# Patient Record
Sex: Male | Born: 1970 | Race: White | Hispanic: No | Marital: Married | Smoking: Current every day smoker
Health system: Southern US, Community
[De-identification: ages and names within clinical notes are randomized; demographics above are authoritative.]

---

## 2018-08-24 DIAGNOSIS — S20459A Superficial foreign body of unspecified back wall of thorax, initial encounter: Secondary | ICD-10-CM | POA: Diagnosis not present

## 2018-08-24 DIAGNOSIS — W57XXXA Bitten or stung by nonvenomous insect and other nonvenomous arthropods, initial encounter: Secondary | ICD-10-CM | POA: Diagnosis not present

## 2018-08-24 DIAGNOSIS — K529 Noninfective gastroenteritis and colitis, unspecified: Secondary | ICD-10-CM | POA: Diagnosis not present

## 2018-08-26 ENCOUNTER — Other Ambulatory Visit: Payer: Self-pay

## 2018-08-26 ENCOUNTER — Emergency Department (HOSPITAL_COMMUNITY)
Admission: EM | Admit: 2018-08-26 | Discharge: 2018-08-26 | Disposition: A | Discharge status: Home / Self Care | Payer: BLUE CROSS/BLUE SHIELD | Attending: Emergency Medicine | Admitting: Emergency Medicine

## 2018-08-26 ENCOUNTER — Encounter (HOSPITAL_COMMUNITY): Payer: Self-pay | Admitting: *Deleted

## 2018-08-26 ENCOUNTER — Emergency Department (HOSPITAL_COMMUNITY): Payer: BLUE CROSS/BLUE SHIELD

## 2018-08-26 DIAGNOSIS — R531 Weakness: Secondary | ICD-10-CM | POA: Insufficient documentation

## 2018-08-26 DIAGNOSIS — W57XXXA Bitten or stung by nonvenomous insect and other nonvenomous arthropods, initial encounter: Secondary | ICD-10-CM | POA: Diagnosis not present

## 2018-08-26 DIAGNOSIS — M6281 Muscle weakness (generalized): Secondary | ICD-10-CM | POA: Diagnosis not present

## 2018-08-26 DIAGNOSIS — L988 Other specified disorders of the skin and subcutaneous tissue: Secondary | ICD-10-CM | POA: Insufficient documentation

## 2018-08-26 DIAGNOSIS — Z79899 Other long term (current) drug therapy: Secondary | ICD-10-CM | POA: Insufficient documentation

## 2018-08-26 DIAGNOSIS — F17210 Nicotine dependence, cigarettes, uncomplicated: Secondary | ICD-10-CM | POA: Diagnosis not present

## 2018-08-26 DIAGNOSIS — S30860A Insect bite (nonvenomous) of lower back and pelvis, initial encounter: Secondary | ICD-10-CM | POA: Diagnosis not present

## 2018-08-26 LAB — COMPREHENSIVE METABOLIC PANEL
ALT: 12 U/L (ref 0–44)
AST: 18 U/L (ref 15–41)
Albumin: 3.8 g/dL (ref 3.5–5.0)
Alkaline Phosphatase: 54 U/L (ref 38–126)
Anion gap: 7 (ref 5–15)
BUN: 11 mg/dL (ref 6–20)
CO2: 27 mmol/L (ref 22–32)
Calcium: 8.9 mg/dL (ref 8.9–10.3)
Chloride: 104 mmol/L (ref 98–111)
Creatinine, Ser: 0.81 mg/dL (ref 0.61–1.24)
GFR calc Af Amer: >60 mL/min (ref >60)
GFR calc non Af Amer: >60 mL/min (ref >60)
Glucose, Bld: 93 mg/dL (ref 70–99)
Potassium: 4.1 mmol/L (ref 3.5–5.1)
Sodium: 138 mmol/L (ref 135–145)
Total Bilirubin: 0.4 mg/dL (ref 0.3–1.2)
Total Protein: 6.5 g/dL (ref 6.5–8.1)

## 2018-08-26 LAB — CBC WITH DIFFERENTIAL/PLATELET
Abs Immature Granulocytes: 0.03 10*3/uL (ref 0.00–0.07)
Basophils Absolute: 0 10*3/uL (ref 0.0–0.1)
Basophils Relative: 1 %
Eosinophils Absolute: 0.4 10*3/uL (ref 0.0–0.5)
Eosinophils Relative: 8 %
HCT: 44.4 % (ref 39.0–52.0)
Hemoglobin: 14.2 g/dL (ref 13.0–17.0)
Immature Granulocytes: 1 %
Lymphocytes Relative: 33 %
Lymphs Abs: 1.7 10*3/uL (ref 0.7–4.0)
MCH: 31.4 pg (ref 26.0–34.0)
MCHC: 32 g/dL (ref 30.0–36.0)
MCV: 98.2 fL (ref 80.0–100.0)
Monocytes Absolute: 0.6 10*3/uL (ref 0.1–1.0)
Monocytes Relative: 11 %
Neutro Abs: 2.4 10*3/uL (ref 1.7–7.7)
Neutrophils Relative %: 46 %
Platelets: 229 10*3/uL (ref 150–400)
RBC: 4.52 MIL/uL (ref 4.22–5.81)
RDW: 14 % (ref 11.5–15.5)
WBC: 5.2 10*3/uL (ref 4.0–10.5)
nRBC: 0 % (ref 0.0–0.2)

## 2018-08-26 NOTE — ED Provider Notes (Signed)
[facility] EMERGENCY DEPARTMENT Provider Note   CSN: [id number] Arrival date & time: 08/26/18  1338    History   Chief Complaint Chief Complaint  Patient presents with  . Weakness    HPI Mark Cobb is a 48 y.o. male.     HPI Presents with weakness myalgias and diarrhea.  Today is Thursday and this weekend he was having some diarrhea.  On Monday or Tuesday he found a tick on his back.  Thinks it likely got on there over the weekend.  States he had to go to an urgent care to get the tick taken off since some of it was stuck.  States he continues to feel weakness.  States the diarrhea slowed up with medicines has been taking.  States he just feels bad.  Has had some chills.  No cough.  Does smoke.  Urgent care started doxycycline 2 days ago.  States he is not gotten better and may even be feeling worse. History reviewed. No pertinent past medical history.  There are no active problems to display for this patient.   History reviewed. No pertinent surgical history.      Home Medications    Prior to Admission medications   Medication Sig Start Date End Date Taking? Authorizing Provider  doxycycline (VIBRAMYCIN) 100 MG capsule Take 100 mg by mouth 2 (two) times a day. 08/24/18  Yes [provider]    Family History No family history on file.  Social History Social History   Tobacco Use  . Smoking status: Current Every Day Smoker    Packs/day: 1.50    Types: Cigarettes  . Smokeless tobacco: Never Used  Substance Use Topics  . Alcohol use: Yes    Comment: occasionally  . Drug use: Never     Allergies   Penicillins   Review of Systems Review of Systems  Constitutional: Positive for appetite change, chills and fatigue.  HENT: Negative for congestion.   Respiratory: Negative for shortness of breath.   Gastrointestinal: Positive for diarrhea. Negative for abdominal pain.  Genitourinary: Negative for flank pain.  Musculoskeletal: Negative for back  pain.  Skin: Positive for rash.  Neurological: Negative for weakness.  Psychiatric/Behavioral: Negative for confusion.     Physical Exam Updated Vital Signs BP 134/78 Comment: Simultaneous filing. User may not have seen previous data.  Pulse 88 Comment: Simultaneous filing. User may not have seen previous data.  Temp 98.1 F (36.7 C) (Oral)   Resp 16   Ht 5\' 8"  (1.727 m)   Wt 81.6 kg   SpO2 99% Comment: Simultaneous filing. User may not have seen previous data.  BMI 27.37 kg/m   Physical Exam Vitals signs and nursing note reviewed.  Constitutional:      Appearance: Normal appearance.  HENT:     Head: Atraumatic.     Mouth/Throat:     Mouth: Mucous membranes are moist.  Eyes:     Extraocular Movements: Extraocular movements intact.  Neck:     Musculoskeletal: Neck supple.  Pulmonary:     Breath sounds: Normal breath sounds.  Abdominal:     Tenderness: There is no abdominal tenderness.  Musculoskeletal:     Right lower leg: No edema.     Left lower leg: No edema.  Skin:    General: Skin is warm.     Comments: Right posterior shoulder area has approximately 1 cm ulcers area.  There is some erythema.  Somewhat around this there is erythematous area with small pustules that  appear to be where some tape had been over the area.  No fluctuance.  No rash over the palms.  Neurological:     General: No focal deficit present.      ED Treatments / Results  Labs (all labs ordered are listed, but only abnormal results are displayed) Labs Reviewed  COMPREHENSIVE METABOLIC PANEL  CBC WITH DIFFERENTIAL/PLATELET    EKG EKG Interpretation  Date/Time:  Thursday August 26 2018 14:07:25 EDT Ventricular Rate:  83 PR Interval:    QRS Duration: 99 QT Interval:  351 QTC Calculation: 413 R Axis:   79 Text Interpretation:  Sinus rhythm Baseline wander in lead(s) V3 Confirmed by Benjiman CorePickering, Bishoy Cupp [phone]([id number]) on 08/26/2018 3:32:45 PM   Radiology Dg Chest 2 View  Result Date:  08/26/2018 CLINICAL DATA:  Generalized weakness and body aches. EXAM: CHEST - 2 VIEW COMPARISON:  None. FINDINGS: The heart size and mediastinal contours are within normal limits. Both lungs are clear. The visualized skeletal structures are unremarkable. IMPRESSION: No active cardiopulmonary disease. Electronically Signed   By: Gerome Samavid  Williams III M.D   On: 08/26/2018 14:35    Procedures Procedures (including critical care time)  Medications Ordered in ED Medications - No data to display   Initial Impression / Assessment and Plan / ED Course  I have reviewed the triage vital signs and the nursing notes.  Pertinent labs & imaging results that were available during my care of the patient were reviewed by me and considered in my medical decision making (see chart for details).        Patient presents with generalized weakness.  Recent tick bite.  On the back has erythema and looks as if he has some reaction to the tape also.  Already on doxycycline but is only had a couple days of it.  I think lab work is reassuring.  Discharge home with continued antibiotics.  If this is a tickborne illness would not expect significant improvement at this time.  Discharge home to follow-up with PCP as needed  Final Clinical Impressions(s) / ED Diagnoses   Final diagnoses:  Generalized weakness  Tick bite, initial encounter    ED Discharge Orders    None       Benjiman CorePickering, Allayna Erlich, MD 08/26/18 1601

## 2018-08-26 NOTE — ED Triage Notes (Signed)
Pt c/o generalized weakness and body aches over the past couple of days. Pt reports he was bitten by a tick over the weekend on his back and was able to remove half of the tick. He was unable to remove all of the tick so he went to Urgent Care on Tuesday and had them remove the remainder. Pt was given Doxycycline then and told to be seen if he continued to feel weak. Pt reports his weakness and body aches are about the same or slightly worse than when it started. Pt also reports having chills.

## 2018-08-26 NOTE — Discharge Instructions (Addendum)
Continue to take the doxycycline.  Watch for worsening of the potential infection on her back.

## 2018-08-31 DIAGNOSIS — W57XXXS Bitten or stung by nonvenomous insect and other nonvenomous arthropods, sequela: Secondary | ICD-10-CM | POA: Diagnosis not present

## 2018-08-31 DIAGNOSIS — R197 Diarrhea, unspecified: Secondary | ICD-10-CM | POA: Diagnosis not present

## 2018-08-31 DIAGNOSIS — E860 Dehydration: Secondary | ICD-10-CM | POA: Diagnosis not present

## 2018-09-07 ENCOUNTER — Encounter (HOSPITAL_COMMUNITY): Payer: Self-pay | Admitting: Emergency Medicine

## 2018-09-07 ENCOUNTER — Emergency Department (HOSPITAL_COMMUNITY)
Admission: EM | Admit: 2018-09-07 | Discharge: 2018-09-07 | Disposition: A | Discharge status: Home / Self Care | Payer: BLUE CROSS/BLUE SHIELD | Attending: Emergency Medicine | Admitting: Emergency Medicine

## 2018-09-07 ENCOUNTER — Other Ambulatory Visit: Payer: Self-pay

## 2018-09-07 ENCOUNTER — Emergency Department (HOSPITAL_COMMUNITY): Payer: BLUE CROSS/BLUE SHIELD

## 2018-09-07 DIAGNOSIS — W57XXXA Bitten or stung by nonvenomous insect and other nonvenomous arthropods, initial encounter: Secondary | ICD-10-CM | POA: Diagnosis not present

## 2018-09-07 DIAGNOSIS — F17210 Nicotine dependence, cigarettes, uncomplicated: Secondary | ICD-10-CM | POA: Insufficient documentation

## 2018-09-07 DIAGNOSIS — R531 Weakness: Secondary | ICD-10-CM | POA: Diagnosis not present

## 2018-09-07 LAB — URINALYSIS, ROUTINE W REFLEX MICROSCOPIC
Bilirubin Urine: NEGATIVE
Glucose, UA: NEGATIVE mg/dL
Hgb urine dipstick: NEGATIVE
Ketones, ur: NEGATIVE mg/dL
Leukocytes,Ua: NEGATIVE
Nitrite: NEGATIVE
Protein, ur: NEGATIVE mg/dL
Specific Gravity, Urine: 1.006 (ref 1.005–1.030)
pH: 6 (ref 5.0–8.0)

## 2018-09-07 LAB — COMPREHENSIVE METABOLIC PANEL
ALT: 10 U/L (ref 0–44)
AST: 18 U/L (ref 15–41)
Albumin: 3.9 g/dL (ref 3.5–5.0)
Alkaline Phosphatase: 60 U/L (ref 38–126)
Anion gap: 9 (ref 5–15)
BUN: 14 mg/dL (ref 6–20)
CO2: 26 mmol/L (ref 22–32)
Calcium: 9.1 mg/dL (ref 8.9–10.3)
Chloride: 102 mmol/L (ref 98–111)
Creatinine, Ser: 0.89 mg/dL (ref 0.61–1.24)
GFR calc Af Amer: >60 mL/min (ref >60)
GFR calc non Af Amer: >60 mL/min (ref >60)
Glucose, Bld: 101 mg/dL — ABNORMAL HIGH (ref 70–99)
Potassium: 4.2 mmol/L (ref 3.5–5.1)
Sodium: 137 mmol/L (ref 135–145)
Total Bilirubin: 0.2 mg/dL — ABNORMAL LOW (ref 0.3–1.2)
Total Protein: 7 g/dL (ref 6.5–8.1)

## 2018-09-07 LAB — CBC
HCT: 44.6 % (ref 39.0–52.0)
Hemoglobin: 14.8 g/dL (ref 13.0–17.0)
MCH: 32.1 pg (ref 26.0–34.0)
MCHC: 33.2 g/dL (ref 30.0–36.0)
MCV: 96.7 fL (ref 80.0–100.0)
Platelets: 257 K/uL (ref 150–400)
RBC: 4.61 MIL/uL (ref 4.22–5.81)
RDW: 13.2 % (ref 11.5–15.5)
WBC: 7 K/uL (ref 4.0–10.5)
nRBC: 0 % (ref 0.0–0.2)

## 2018-09-07 LAB — TROPONIN I: Troponin I: <0.03 ng/mL (ref <0.03)

## 2018-09-07 LAB — CBG MONITORING, ED: Glucose-Capillary: 98 mg/dL (ref 70–99)

## 2018-09-07 LAB — TSH: TSH: 1.863 u[IU]/mL (ref 0.350–4.500)

## 2018-09-07 NOTE — Discharge Instructions (Addendum)
Test showed no life-threatening condition.  You need a primary care doctor.

## 2018-09-07 NOTE — ED Triage Notes (Signed)
Pt c/o generalized weakness x 1 week. Pt reports he was told to come to ED by urgent care for abnormal lab work?

## 2018-09-07 NOTE — ED Provider Notes (Signed)
[facility] EMERGENCY DEPARTMENT Provider Note   CSN: [id number] Arrival date & time: 09/07/18  1401    History   Chief Complaint Chief Complaint  Patient presents with  . Weakness    HPI Mark Cobb is a 48 y.o. male.     Generalized weakness for 2 weeks.  Symptoms first started after tick bite.  He was placed on doxycycline for 10 days for same.  Patient evaluated on 08/26/2018 for similar complaints.  CBC and chemistry panel normal at that time.  No chest pain, dyspnea, fever, sweats, chills, weight loss, neuro deficits, headache, new rash.  He states he is normally healthy.  Severity is moderate.  Nothing makes symptoms better or worse.     History reviewed. No pertinent past medical history.  There are no active problems to display for this patient.   History reviewed. No pertinent surgical history.      Home Medications    Prior to Admission medications   Medication Sig Start Date End Date Taking? Authorizing Provider  acetaminophen (TYLENOL) 500 MG tablet Take 500 mg by mouth every 6 (six) hours as needed for mild pain or moderate pain.   Yes [provider]  ibuprofen (ADVIL) 200 MG tablet Take 200 mg by mouth every 6 (six) hours as needed for mild pain or moderate pain.   Yes [provider]    Family History History reviewed. No pertinent family history.  Social History Social History   Tobacco Use  . Smoking status: Current Every Day Smoker    Packs/day: 1.50    Types: Cigarettes  . Smokeless tobacco: Never Used  Substance Use Topics  . Alcohol use: Yes    Comment: occasionally  . Drug use: Never     Allergies   Penicillins   Review of Systems Review of Systems  All other systems reviewed and are negative.    Physical Exam Updated Vital Signs BP 123/73   Pulse 78   Temp 98.3 F (36.8 C)   Resp 16   Ht 5\' 8"  (1.727 m)   Wt 81 kg   SpO2 97%   BMI 27.15 kg/m   Physical Exam Vitals signs and nursing note  reviewed.  Constitutional:      Appearance: He is well-developed.  HENT:     Head: Normocephalic and atraumatic.  Eyes:     Conjunctiva/sclera: Conjunctivae normal.  Neck:     Musculoskeletal: Neck supple.  Cardiovascular:     Rate and Rhythm: Normal rate and regular rhythm.  Pulmonary:     Effort: Pulmonary effort is normal.     Breath sounds: Normal breath sounds.  Abdominal:     General: Bowel sounds are normal.     Palpations: Abdomen is soft.  Musculoskeletal: Normal range of motion.  Skin:    General: Skin is warm and dry.  Neurological:     Mental Status: He is alert and oriented to person, place, and time.  Psychiatric:        Behavior: Behavior normal.      ED Treatments / Results  Labs (all labs ordered are listed, but only abnormal results are displayed) Labs Reviewed  URINALYSIS, ROUTINE W REFLEX MICROSCOPIC - Abnormal; Notable for the following components:      Result Value   Color, Urine STRAW (*)    All other components within normal limits  COMPREHENSIVE METABOLIC PANEL - Abnormal; Notable for the following components:   Glucose, Bld 101 (*)    Total Bilirubin 0.2 (*)  All other components within normal limits  CBC  TSH  TROPONIN I  ROCKY MTN SPOTTED FVR ABS PNL(IGG+IGM)  LYME DISEASE DNA BY PCR(BORRELIA BURG)  CBG MONITORING, ED    EKG EKG Interpretation  Date/Time:  Tuesday Sep 07 2018 16:36:08 EDT Ventricular Rate:  87 PR Interval:    QRS Duration: 86 QT Interval:  350 QTC Calculation: 421 R Axis:   71 Text Interpretation:  Sinus rhythm Confirmed by Donnetta Hutching ([id number]) on 09/07/2018 5:55:33 PM   Radiology Dg Chest 2 View  Result Date: 09/07/2018 CLINICAL DATA:  Generalized weakness for approximately 2 weeks. EXAM: CHEST - 2 VIEW COMPARISON:  PA and lateral chest 08/26/2018. FINDINGS: The lungs are clear. Heart size is normal. No pneumothorax or pleural effusion. No bony abnormality. IMPRESSION: Negative chest. Electronically Signed    By: Drusilla Kanner M.D.   On: 09/07/2018 18:32    Procedures Procedures (including critical care time)  Medications Ordered in ED Medications - No data to display   Initial Impression / Assessment and Plan / ED Course  I have reviewed the triage vital signs and the nursing notes.  Pertinent labs & imaging results that were available during my care of the patient were reviewed by me and considered in my medical decision making (see chart for details).        Patient presents with weakness and general malaise.  He coordinates his symptoms with a tick bite couple weeks ago.  Labs show no life-threatening condition.  RMSF and Lyme titer are pending.  Will discharge for primary care follow-up.  Final Clinical Impressions(s) / ED Diagnoses   Final diagnoses:  Weakness    ED Discharge Orders    None       Donnetta Hutching, MD 09/07/18 2041

## 2018-09-09 DIAGNOSIS — R5383 Other fatigue: Secondary | ICD-10-CM | POA: Diagnosis not present

## 2018-09-09 DIAGNOSIS — M791 Myalgia, unspecified site: Secondary | ICD-10-CM | POA: Diagnosis not present

## 2018-09-09 DIAGNOSIS — M2550 Pain in unspecified joint: Secondary | ICD-10-CM | POA: Diagnosis not present

## 2018-09-09 DIAGNOSIS — H6692 Otitis media, unspecified, left ear: Secondary | ICD-10-CM | POA: Diagnosis not present

## 2018-09-09 LAB — ROCKY MTN SPOTTED FVR ABS PNL(IGG+IGM)
RMSF IgG: POSITIVE — AB
RMSF IgM: 0.38 index (ref 0.00–0.89)

## 2018-09-09 LAB — RMSF, IGG, IFA: RMSF, IGG, IFA: <1:64 {titer}

## 2018-09-14 LAB — LYME DISEASE DNA BY PCR(BORRELIA BURG): Lyme Disease(B.burgdorferi)PCR: NEGATIVE

## 2018-09-21 DIAGNOSIS — A770 Spotted fever due to Rickettsia rickettsii: Secondary | ICD-10-CM | POA: Diagnosis not present

## 2018-09-21 DIAGNOSIS — R5383 Other fatigue: Secondary | ICD-10-CM | POA: Diagnosis not present

## 2018-09-21 DIAGNOSIS — M2550 Pain in unspecified joint: Secondary | ICD-10-CM | POA: Diagnosis not present

## 2018-09-21 DIAGNOSIS — M791 Myalgia, unspecified site: Secondary | ICD-10-CM | POA: Diagnosis not present

## 2018-09-29 DIAGNOSIS — M2550 Pain in unspecified joint: Secondary | ICD-10-CM | POA: Diagnosis not present

## 2018-09-29 DIAGNOSIS — Z0189 Encounter for other specified special examinations: Secondary | ICD-10-CM | POA: Diagnosis not present

## 2018-09-29 DIAGNOSIS — A770 Spotted fever due to Rickettsia rickettsii: Secondary | ICD-10-CM | POA: Diagnosis not present

## 2018-09-29 DIAGNOSIS — M791 Myalgia, unspecified site: Secondary | ICD-10-CM | POA: Diagnosis not present

## 2018-09-29 DIAGNOSIS — R5383 Other fatigue: Secondary | ICD-10-CM | POA: Diagnosis not present

## 2018-09-29 DIAGNOSIS — H6692 Otitis media, unspecified, left ear: Secondary | ICD-10-CM | POA: Diagnosis not present

## 2019-12-17 IMAGING — DX CHEST - 2 VIEW
2 series · 2 of 2 positions shown · non-contrast
Comparison: PA and lateral chest 08/26/2018.

CLINICAL DATA: Generalized weakness for approximately 2 weeks.

EXAM:
CHEST - 2 VIEW

[chest lat]
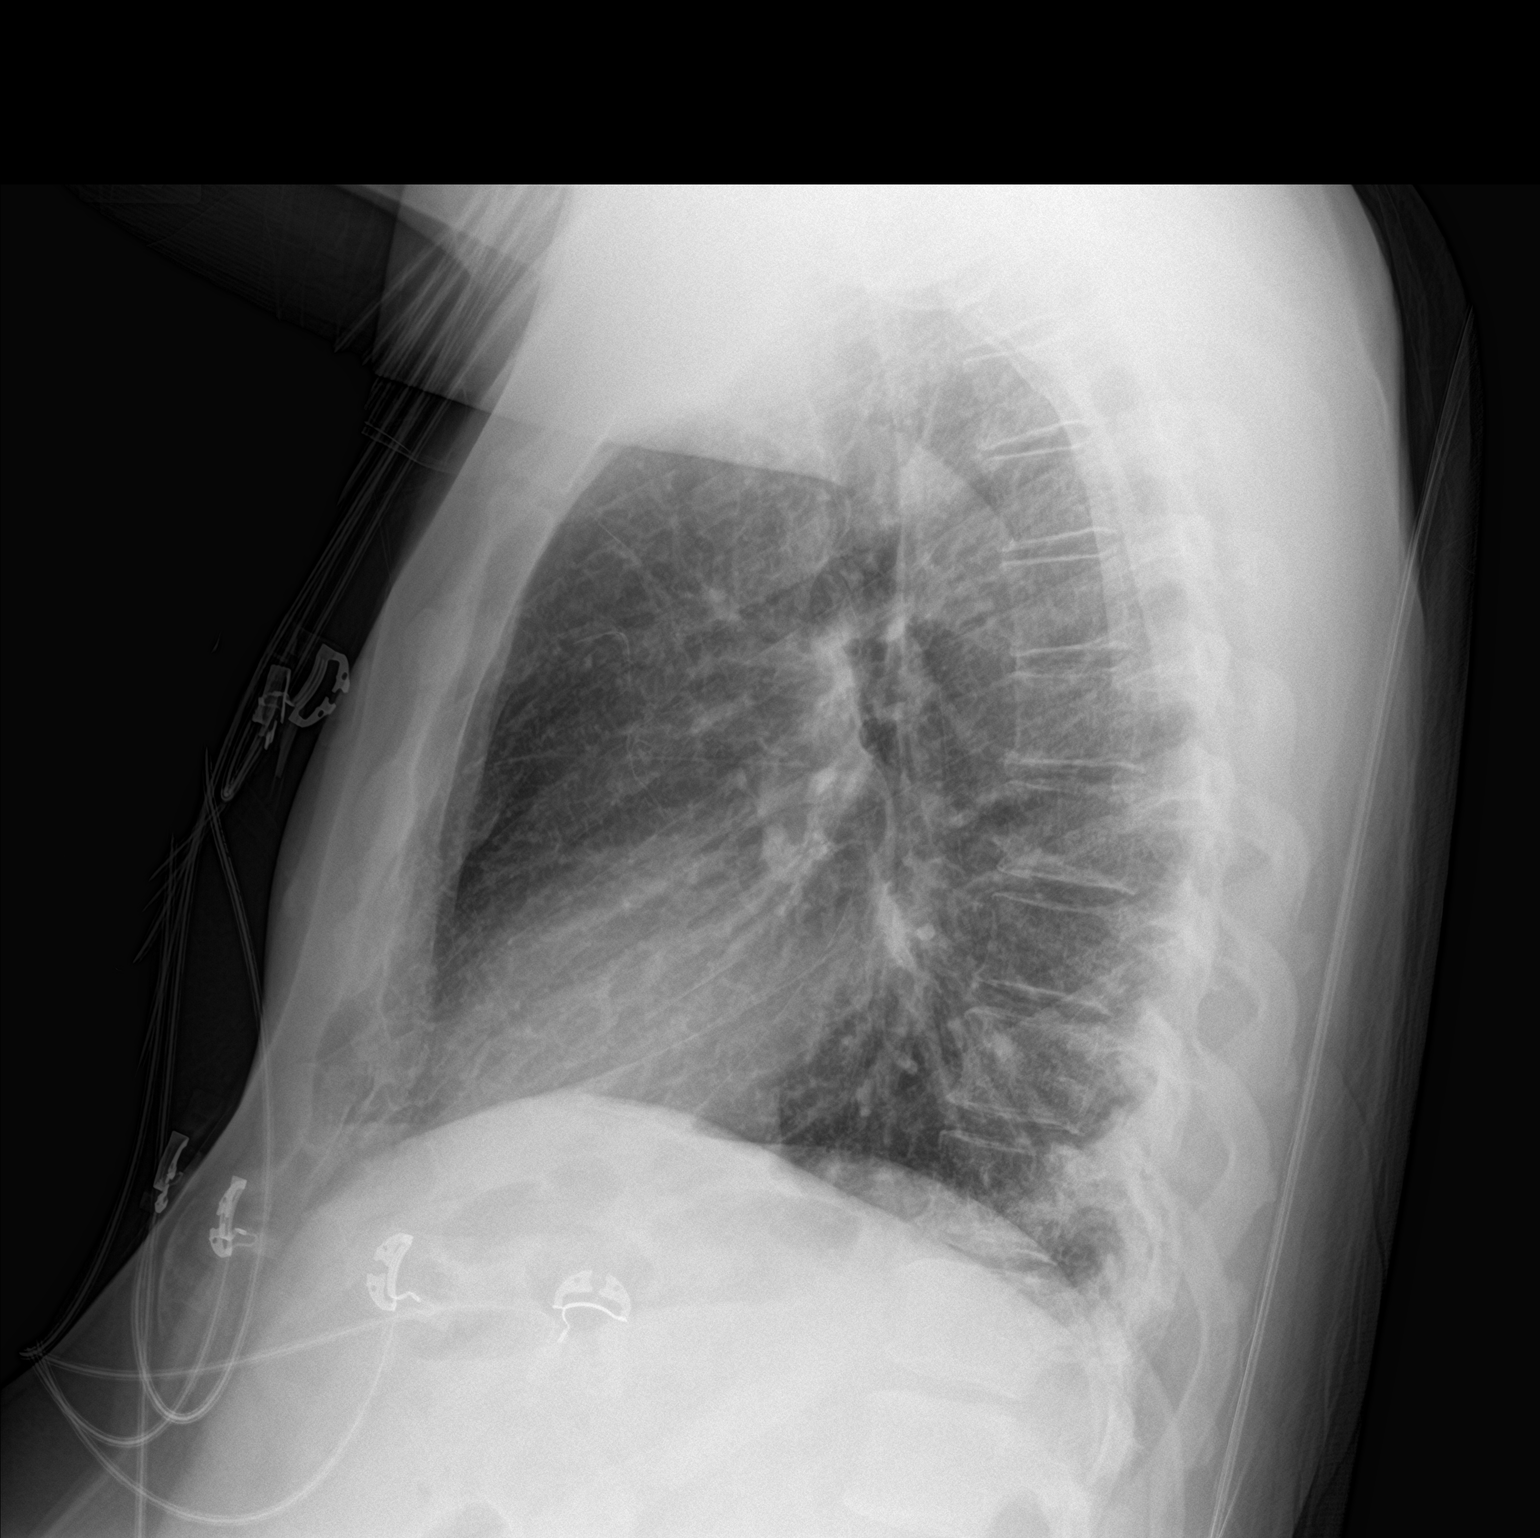

[chest ap]
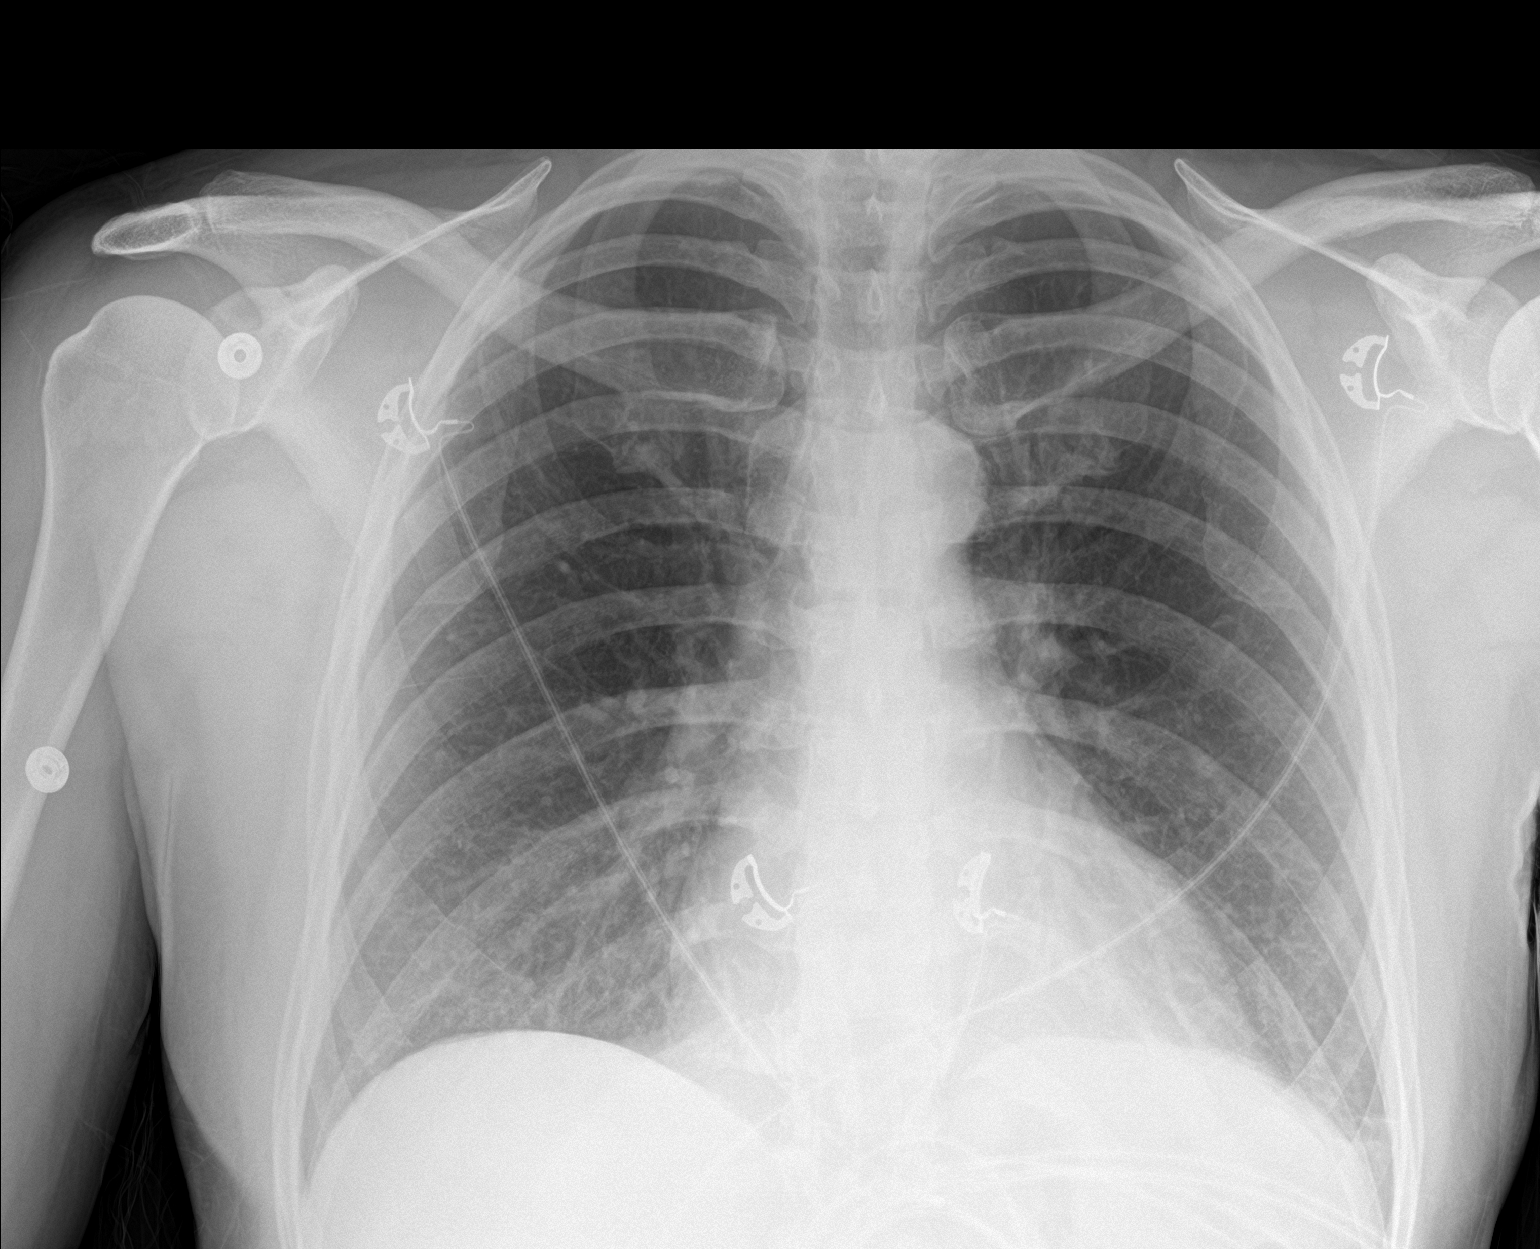

[2 of 2 positions shown; findings below may reference images not displayed]

FINDINGS: The lungs are clear. Heart size is normal. No pneumothorax or
pleural effusion. No bony abnormality.
IMPRESSION: Negative chest.

## 2020-10-08 ENCOUNTER — Emergency Department (HOSPITAL_COMMUNITY)
Admission: EM | Admit: 2020-10-08 | Discharge: 2020-10-08 | Disposition: A | Discharge status: Home / Self Care | Payer: Self-pay | Attending: Emergency Medicine | Admitting: Emergency Medicine

## 2020-10-08 ENCOUNTER — Encounter (HOSPITAL_COMMUNITY): Payer: Self-pay

## 2020-10-08 ENCOUNTER — Emergency Department (HOSPITAL_COMMUNITY): Payer: Self-pay

## 2020-10-08 ENCOUNTER — Other Ambulatory Visit: Payer: Self-pay

## 2020-10-08 DIAGNOSIS — R230 Cyanosis: Secondary | ICD-10-CM | POA: Insufficient documentation

## 2020-10-08 DIAGNOSIS — R55 Syncope and collapse: Secondary | ICD-10-CM | POA: Insufficient documentation

## 2020-10-08 DIAGNOSIS — R519 Headache, unspecified: Secondary | ICD-10-CM | POA: Insufficient documentation

## 2020-10-08 DIAGNOSIS — F17210 Nicotine dependence, cigarettes, uncomplicated: Secondary | ICD-10-CM | POA: Insufficient documentation

## 2020-10-08 DIAGNOSIS — T402X1A Poisoning by other opioids, accidental (unintentional), initial encounter: Secondary | ICD-10-CM | POA: Insufficient documentation

## 2020-10-08 DIAGNOSIS — T40601A Poisoning by unspecified narcotics, accidental (unintentional), initial encounter: Secondary | ICD-10-CM

## 2020-10-08 DIAGNOSIS — X58XXXA Exposure to other specified factors, initial encounter: Secondary | ICD-10-CM | POA: Insufficient documentation

## 2020-10-08 LAB — CBG MONITORING, ED: Glucose-Capillary: 224 mg/dL — ABNORMAL HIGH (ref 70–99)

## 2020-10-08 MED ORDER — LACTATED RINGERS IV BOLUS
1000.0000 mL | Freq: Once | INTRAVENOUS | Status: AC
  Administered 2020-10-08: 1000 mL via INTRAVENOUS

## 2020-10-08 MED ORDER — NALOXONE HCL 4 MG/0.1ML NA LIQD: NASAL | 1 refills | Status: AC

## 2020-10-08 NOTE — ED Triage Notes (Signed)
Pt arrived via [facility] after over dose at home. [facility] report Pt needed 8mg  of Narcan before becoming alert again. EDP at bedside during Triage.

## 2020-10-08 NOTE — ED Provider Notes (Signed)
[facility] EMERGENCY DEPARTMENT Provider Note   CSN: [id number] Arrival date & time: 10/08/20  0410     History Chief Complaint  Patient presents with   Drug Overdose    Mark Cobb is a 50 y.o. male.  Snorted heroin. Lost consciousness. Family started CPR. EMS arrived, was cyanotic but had a pulse. 8mg  total narcan (various routes) prior to improving MS.   The history is provided by the patient and the EMS personnel.  Drug Overdose This is a new problem. The current episode started less than 1 hour ago. The problem occurs constantly. The problem has been resolved. Associated symptoms include headaches. Pertinent negatives include no chest pain, no abdominal pain and no shortness of breath. Nothing aggravates the symptoms. Nothing relieves the symptoms.      History reviewed. No pertinent past medical history.  There are no problems to display for this patient.   History reviewed. No pertinent surgical history.     History reviewed. No pertinent family history.  Social History   Tobacco Use   Smoking status: Every Day    Packs/day: 1.50    Pack years: 0.00    Types: Cigarettes   Smokeless tobacco: Never  Vaping Use   Vaping Use: Never used  Substance Use Topics   Alcohol use: Yes    Comment: occasionally   Drug use: Yes    Comment: Heroin    Home Medications Prior to Admission medications   Medication Sig Start Date End Date Taking? Authorizing Provider  naloxone [facility]) nasal spray 4 mg/0.1 mL Use as directed on manufacturer packaging 10/08/20  Yes Kian Gamarra, 10/10/20, MD  acetaminophen (TYLENOL) 500 MG tablet Take 500 mg by mouth every 6 (six) hours as needed for mild pain or moderate pain.    [provider]  ibuprofen (ADVIL) 200 MG tablet Take 200 mg by mouth every 6 (six) hours as needed for mild pain or moderate pain.    [provider]    Allergies    Penicillins  Review of Systems   Review of Systems  Respiratory:  Negative for  shortness of breath.   Cardiovascular:  Negative for chest pain.  Gastrointestinal:  Negative for abdominal pain.  Neurological:  Positive for headaches.  All other systems reviewed and are negative.  Physical Exam Updated Vital Signs BP 118/71   Pulse 88   Temp 99.3 F (37.4 C) (Oral)   Resp 10   Ht 5\' 8"  (1.727 m)   Wt 82 kg   SpO2 94%   BMI 27.49 kg/m   Physical Exam Vitals and nursing note reviewed.  Constitutional:      Appearance: He is well-developed.  HENT:     Head: Normocephalic and atraumatic.     Mouth/Throat:     Mouth: Mucous membranes are moist.     Pharynx: Oropharynx is clear.  Eyes:     Pupils: Pupils are equal, round, and reactive to light.  Cardiovascular:     Rate and Rhythm: Normal rate.  Pulmonary:     Effort: Pulmonary effort is normal. No respiratory distress.  Abdominal:     General: There is no distension.  Musculoskeletal:        General: Normal range of motion.     Cervical back: Normal range of motion.  Skin:    General: Skin is warm and dry.  Neurological:     General: No focal deficit present.     Mental Status: He is alert.    ED Results /  Procedures / Treatments   Labs (all labs ordered are listed, but only abnormal results are displayed) Labs Reviewed  CBG MONITORING, ED - Abnormal; Notable for the following components:      Result Value   Glucose-Capillary 224 (*)    All other components within normal limits    EKG EKG Interpretation  Date/Time:  Monday October 08 2020 04:17:04 EDT Ventricular Rate:  122 PR Interval:  165 QRS Duration: 89 QT Interval:  315 QTC Calculation: 449 R Axis:   61 Text Interpretation: Sinus tachycardia Left atrial enlargement Confirmed by Marily Memos [phone]) on 10/08/2020 4:24:03 AM  Radiology DG Chest Portable 1 View  Result Date: 10/08/2020 CLINICAL DATA:  Altered mental status, reported overdose EXAM: PORTABLE CHEST 1 VIEW COMPARISON:  Radiograph 09/07/2018 FINDINGS: Low lung volumes  and streaky atelectasis without consolidation, significant airways thickening or other acute cardiopulmonary abnormality. The cardiomediastinal contours are unremarkable. No acute osseous or soft tissue abnormality. Telemetry leads overlie the chest. IMPRESSION: Low volumes and atelectasis without other acute cardiopulmonary abnormality. Electronically Signed   By: Kreg Shropshire M.D.   On: 10/08/2020 04:48    Procedures Procedures   Medications Ordered in ED Medications  lactated ringers bolus 1,000 mL (0 mLs Intravenous Stopped 10/08/20 0710)    ED Course  I have reviewed the triage vital signs and the nursing notes.  Pertinent labs & imaging results that were available during my care of the patient were reviewed by me and considered in my medical decision making (see chart for details).    MDM Rules/Calculators/A&P                         Ecg/cbg and observation. Ecg/cbg ok. VS WNL and stable. Plan for observation until 0800.  Patient with family member at bedside. Wants to leave. Has been quite a while since he's been here and has been awake and breathing normal. No need for repeat narcan. No indication for admission. States he will quit drugs. Narcan Rx given.   Final Clinical Impression(s) / ED Diagnoses Final diagnoses:  Opiate overdose, accidental or unintentional, initial encounter [facility])    Rx / DC Orders ED Discharge Orders          Ordered    naloxone [facility]) nasal spray 4 mg/0.1 mL        10/08/20 0705             Kamar Callender, Barbara Cower, MD 10/09/20 0100

## 2020-10-08 NOTE — ED Triage Notes (Signed)
Pt reports he was supposed to be snorting Heroin tonight and was using his right nostril. Pt reports this over dose was accidental and was not an attempt at Suicide.

## 2021-05-16 ENCOUNTER — Other Ambulatory Visit: Payer: Self-pay

## 2021-05-16 ENCOUNTER — Emergency Department (HOSPITAL_COMMUNITY)
Admission: EM | Admit: 2021-05-16 | Discharge: 2021-05-16 | Disposition: A | Discharge status: Home / Self Care | Payer: Self-pay | Attending: Emergency Medicine | Admitting: Emergency Medicine

## 2021-05-16 ENCOUNTER — Emergency Department (HOSPITAL_COMMUNITY): Payer: Self-pay

## 2021-05-16 ENCOUNTER — Encounter (HOSPITAL_COMMUNITY): Payer: Self-pay

## 2021-05-16 DIAGNOSIS — Z20822 Contact with and (suspected) exposure to covid-19: Secondary | ICD-10-CM | POA: Insufficient documentation

## 2021-05-16 DIAGNOSIS — R4182 Altered mental status, unspecified: Secondary | ICD-10-CM | POA: Insufficient documentation

## 2021-05-16 DIAGNOSIS — F1910 Other psychoactive substance abuse, uncomplicated: Secondary | ICD-10-CM

## 2021-05-16 LAB — URINALYSIS, ROUTINE W REFLEX MICROSCOPIC
Bilirubin Urine: NEGATIVE
Glucose, UA: 150 mg/dL — AB
Hgb urine dipstick: NEGATIVE
Ketones, ur: NEGATIVE mg/dL
Leukocytes,Ua: NEGATIVE
Nitrite: NEGATIVE
Protein, ur: NEGATIVE mg/dL
Specific Gravity, Urine: 1.013 (ref 1.005–1.030)
pH: 6 (ref 5.0–8.0)

## 2021-05-16 LAB — RESP PANEL BY RT-PCR (FLU A&B, COVID) ARPGX2
Influenza A by PCR: NEGATIVE
Influenza B by PCR: NEGATIVE
SARS Coronavirus 2 by RT PCR: NEGATIVE

## 2021-05-16 LAB — CBC WITH DIFFERENTIAL/PLATELET
Abs Immature Granulocytes: 0.06 10*3/uL (ref 0.00–0.07)
Basophils Absolute: 0 10*3/uL (ref 0.0–0.1)
Basophils Relative: 0 %
Eosinophils Absolute: 0.1 10*3/uL (ref 0.0–0.5)
Eosinophils Relative: 1 %
HCT: 44.3 % (ref 39.0–52.0)
Hemoglobin: 14.5 g/dL (ref 13.0–17.0)
Immature Granulocytes: 1 %
Lymphocytes Relative: 7 %
Lymphs Abs: 0.7 10*3/uL (ref 0.7–4.0)
MCH: 30.8 pg (ref 26.0–34.0)
MCHC: 32.7 g/dL (ref 30.0–36.0)
MCV: 94.1 fL (ref 80.0–100.0)
Monocytes Absolute: 0.8 10*3/uL (ref 0.1–1.0)
Monocytes Relative: 7 %
Neutro Abs: 8.8 10*3/uL — ABNORMAL HIGH (ref 1.7–7.7)
Neutrophils Relative %: 84 %
Platelets: 293 10*3/uL (ref 150–400)
RBC: 4.71 MIL/uL (ref 4.22–5.81)
RDW: 13.2 % (ref 11.5–15.5)
WBC: 10.4 10*3/uL (ref 4.0–10.5)
nRBC: 0 % (ref 0.0–0.2)

## 2021-05-16 LAB — COMPREHENSIVE METABOLIC PANEL
ALT: 13 U/L (ref 0–44)
AST: 31 U/L (ref 15–41)
Albumin: 3.8 g/dL (ref 3.5–5.0)
Alkaline Phosphatase: 62 U/L (ref 38–126)
Anion gap: 7 (ref 5–15)
BUN: 12 mg/dL (ref 6–20)
CO2: 28 mmol/L (ref 22–32)
Calcium: 8.9 mg/dL (ref 8.9–10.3)
Chloride: 103 mmol/L (ref 98–111)
Creatinine, Ser: 0.86 mg/dL (ref 0.61–1.24)
GFR, Estimated: >60 mL/min (ref >60)
Glucose, Bld: 102 mg/dL — ABNORMAL HIGH (ref 70–99)
Potassium: 4.7 mmol/L (ref 3.5–5.1)
Sodium: 138 mmol/L (ref 135–145)
Total Bilirubin: 0.5 mg/dL (ref 0.3–1.2)
Total Protein: 6.6 g/dL (ref 6.5–8.1)

## 2021-05-16 LAB — I-STAT CHEM 8, ED
BUN: 11 mg/dL (ref 6–20)
Calcium, Ion: 1.15 mmol/L (ref 1.15–1.40)
Chloride: 103 mmol/L (ref 98–111)
Creatinine, Ser: 0.8 mg/dL (ref 0.61–1.24)
Glucose, Bld: 105 mg/dL — ABNORMAL HIGH (ref 70–99)
HCT: 44 % (ref 39.0–52.0)
Hemoglobin: 15 g/dL (ref 13.0–17.0)
Potassium: 4.7 mmol/L (ref 3.5–5.1)
Sodium: 138 mmol/L (ref 135–145)
TCO2: 27 mmol/L (ref 22–32)

## 2021-05-16 LAB — CBG MONITORING, ED: Glucose-Capillary: 105 mg/dL — ABNORMAL HIGH (ref 70–99)

## 2021-05-16 LAB — RAPID URINE DRUG SCREEN, HOSP PERFORMED
Amphetamines: POSITIVE — AB
Barbiturates: NOT DETECTED
Benzodiazepines: POSITIVE — AB
Cocaine: POSITIVE — AB
Opiates: POSITIVE — AB
Tetrahydrocannabinol: NOT DETECTED

## 2021-05-16 LAB — LACTIC ACID, PLASMA: Lactic Acid, Venous: 2 mmol/L (ref 0.5–1.9)

## 2021-05-16 LAB — ETHANOL: Alcohol, Ethyl (B): <10 mg/dL (ref <10)

## 2021-05-16 MED ORDER — SODIUM CHLORIDE 0.9 % IV BOLUS
1000.0000 mL | Freq: Once | INTRAVENOUS | Status: AC
  Administered 2021-05-16: 1000 mL via INTRAVENOUS

## 2021-05-16 MED ORDER — SODIUM CHLORIDE 0.9 % IV SOLN: INTRAVENOUS | Status: DC

## 2021-05-16 NOTE — ED Provider Notes (Addendum)
I have personally examined this patient, he is much more awake and alert and able to ambulate back and forth to the bathroom.  His drug screen came back positive for opiates, cocaine, benzodiazepines and amphetamines.  His vital signs have remained stable, he has not hypoxic or tachycardic or tachypneic.  At this time he has been counseled on his polysubstance abuse by myself and appears stable for discharge.   The family member at the bedside who calls herself his wife states that she understands the indications for return and expressed the understanding's of the results that were given to both the patient and herself.  Eber Hong, MD 05/16/21 2158    Eber Hong, MD 05/16/21 2159

## 2021-05-16 NOTE — ED Notes (Signed)
Patient encouraged to pee through male purewick at this time.

## 2021-05-16 NOTE — ED Notes (Signed)
The doc ,nurse and I was un dressing the pt and doc found the pt's money in his wet sock. I handed the money that was in pt's sock to [employer] and she put the money in her pocket.
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
## 2021-05-16 NOTE — Discharge Instructions (Signed)
Thankfully your testing did not show any signs of pathologic problems other than your drug screen which was positive for multiple different illegal substances.  I suspect that the major reason that you had your difficulty staying awake and difficulty breathing was because of opiate overdose.  Whether this was heroin or fentanyl these are both life-threatening conditions and you need to stop using them immediately.  If you decide to seek help for treatment for substance abuse problems please see the attached list which will give you the right information about local programs  Substance Abuse Treatment Programs  Intensive Outpatient Programs [facility]     [address]      [place], [place]                   [phone]       [facility] [address] [place] [address] [place], [place] [phone]  [facility]     (Inpatient and outpatient)     [address]           [phone]    [facility] [phone] (Suboxone and Methadone)  [address]      [place], [place] [address]      [phone]       [address] [place], [place] [phone]  [facility] (Outpatient/Inpatient, [facility])    (insurance only) [phone]             [facility] (Groups & Residential) [place], [place] [phone]     [facility]     [address]     [place], [place]      [phone]       [facility] (for caregivers and family) [phone] [address] [place], [place] [phone]      Residential Treatment Programs [facility]      [address] [place], [place] [address]  [phone]       [facility] [address] [place], [place] [address] [phone]  [facility]        [phone]       [facility] [phone]  [facility] ([facility]             [address]                                         [place], [place]                                                 [phone] or [phone]                               [facility] [address] [place], [address] [phone]  [facility]    [address]      [place], [place]     [phone]       [facility] [address] [place], [place] [phone]  [facility]   [address] [place], [place] [address]     [phone]      Admissions: 8am-3pm M-F  [facility] ([facility]) [address] [place], [place] [phone]  [facility]: Residential Program ([address]   [place], [place]      [phone] or [phone]     [facility]: [facility] [place], [place] (Walk in Hours over the weekend or by referral)  [facility] [address] [place], [place], [place] [address] [phone]  Crisis Mobile: [facility]:  [phone] (for crisis response 24 hours a day) [facility] Hotline:      [phone]

## 2021-05-16 NOTE — ED Triage Notes (Signed)
Per EMS patient's wife saw him go unconscious and wife gave IM narcan and patient awoke after narcan administration. Unknown amount. 2 mg given en route with EMS. CBG 281. Patient shaking and increased HR in triage.

## 2021-05-16 NOTE — ED Provider Notes (Signed)
[facility] EMERGENCY DEPARTMENT Provider Note   CSN: [id number] Arrival date & time: 05/16/21  1345     History  Chief Complaint  Patient presents with   Drug Overdose    Mark Cobb is a 51 y.o. male.   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  He was seen in June of this past year for opiate overdose. Patient was brought into the ED for evaluation of an episode of unresponsiveness and questionable opiate overdose.  Patient became unresponsive at home.  According to the EMS patient's wife threw water on him trying to wake him up.  When EMS arrived he was unresponsive and they administered Narcan.  Patient had significant improvement in his mental status.  On arrival to the ED the patient denies any opiate use or overdose.  No known fevers or chills.  He is denying any pain.  Patient states he feels cold but he is in wet clothes.  Patient does not know how he got here or what happened.  Home Medications Prior to Admission medications   Medication Sig Start Date End Date Taking? Authorizing Provider  acetaminophen (TYLENOL) 500 MG tablet Take 500 mg by mouth every 6 (six) hours as needed for mild pain or moderate pain.    [provider]  ibuprofen (ADVIL) 200 MG tablet Take 200 mg by mouth every 6 (six) hours as needed for mild pain or moderate pain.    [provider]  naloxone [employer]) nasal spray 4 mg/0.1 mL Use as directed on manufacturer packaging 10/08/20   Mesner, Barbara Cower, MD      Allergies    Penicillins    Review of Systems   Review of Systems  Constitutional:  Negative for fever.   Physical Exam Updated Vital Signs BP 92/62    Pulse 96    Temp 98.8 F (37.1 C) (Oral)    Resp 14    Ht 1.727 m (5\' 8" )    Wt 85 kg    SpO2 92%    BMI 28.49 kg/m  Physical Exam Vitals and nursing note reviewed.  Constitutional:      Appearance: He is well-developed. He is ill-appearing.  HENT:      Head: Normocephalic and atraumatic.     Right Ear: External ear normal.     Left Ear: External ear normal.  Eyes:     General: No scleral icterus.       Right eye: No discharge.        Left eye: No discharge.     Conjunctiva/sclera: Conjunctivae normal.  Neck:     Trachea: No tracheal deviation.  Cardiovascular:     Rate and Rhythm: Normal rate and regular rhythm.  Pulmonary:     Effort: Pulmonary effort is normal. No respiratory distress.     Breath sounds: Normal breath sounds. No stridor. No wheezing or rales.  Abdominal:     General: Bowel sounds are normal. There is no distension.     Palpations: Abdomen is soft.     Tenderness: There is no abdominal tenderness. There is no guarding or rebound.  Musculoskeletal:        General: No tenderness or deformity.     Cervical back: Neck supple.  Skin:    General: Skin is warm and dry.     Findings: No rash.  Neurological:     General: No focal deficit present.     Mental Status: He is  alert.     Cranial Nerves: No cranial nerve deficit (no facial droop, extraocular movements intact, no slurred speech).     Sensory: No sensory deficit.     Motor: No abnormal muscle tone or seizure activity.     Coordination: Coordination normal.     Comments: Tremulous  Psychiatric:        Mood and Affect: Mood normal.    ED Results / Procedures / Treatments   Labs (all labs ordered are listed, but only abnormal results are displayed) Labs Reviewed  RESP PANEL BY RT-PCR (FLU A&B, COVID) [id number]  COMPREHENSIVE METABOLIC PANEL  CBC WITH DIFFERENTIAL/PLATELET  URINALYSIS, ROUTINE W REFLEX MICROSCOPIC  LACTIC ACID, PLASMA  ETHANOL  RAPID URINE DRUG SCREEN, HOSP PERFORMED  CBG MONITORING, ED  I-STAT CHEM 8, ED    EKG EKG Interpretation  Date/Time:  Thursday May 16 2021 14:09:26 EST Ventricular Rate:  109 PR Interval:  164 QRS Duration: 89 QT Interval:  319 QTC Calculation: 430 R Axis:   69 Text Interpretation: Sinus tachycardia  Probable left atrial enlargement Low voltage, precordial leads Baseline wander in lead(s) V6 Confirmed by Linwood Dibbles [phone]) on 05/16/2021 3:11:32 PM  Radiology No results found.  Procedures .1-3 Lead EKG Interpretation Performed by: Linwood Dibbles, MD Authorized by: Linwood Dibbles, MD     Interpretation: normal     ECG rate:  100   ECG rate assessment: tachycardic     Rhythm: sinus rhythm     Ectopy: none     Conduction: normal      Medications Ordered in ED Medications  sodium chloride 0.9 % bolus 1,000 mL (has no administration in time range)    And  0.9 %  sodium chloride infusion (has no administration in time range)    ED Course/ Medical Decision Making/ A&P Clinical Course as of 05/16/21 1616  Thu May 16, 2021  1513 Labs and x-rays currently pending [JK]  1616 Spoke with the family.  No definite drug use noted.  Patient states he might have used some drugs today he just does not remember [JK]    Clinical Course User Index [JK] Linwood Dibbles, MD                           Medical Decision Making Amount and/or Complexity of Data Reviewed Labs: ordered. Radiology: ordered.  Risk Prescription drug management.   Patient presents with an episode of altered mental status and unresponsiveness.  Presumed to be possible opiate overdose as he does have history of this and responded to Narcan but patient is denying that at this time.  We will need to try and corroborate with family members if possible.  We will proceed with metabolic work-up for possible stroke, anemia, infection, dehydration, acute kidney injury.  Laboratory tests have been ordered.  Patient's blood pressures soft and slightly tachycardic.  We will send off lactic acid levels and start empiric fluid resuscitation  Care turned over to oncoming team.        Final Clinical Impression(s) / ED Diagnoses Final diagnoses:  Altered mental status, unspecified altered mental status type     Linwood Dibbles, MD 05/16/21  1513

## 2021-05-16 NOTE — ED Triage Notes (Signed)
Wife states 10-15 minutes of unconsciousness.

## 2022-08-25 IMAGING — CT CT HEAD W/O CM
3 series · 15 of 47 positions shown, 18 images · non-contrast
Comparison: None.

CLINICAL DATA: Drug overdose, altered mental status



[Series 2: head w o · axial · 0.42mm/px · z∈[+1615,+1750]mm · 9 of 33 slices shown, 12 images]
[im 3/33  brain]
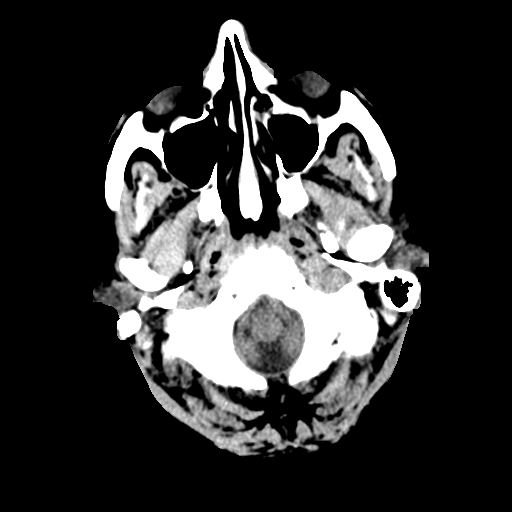
[im 3/33  bone]
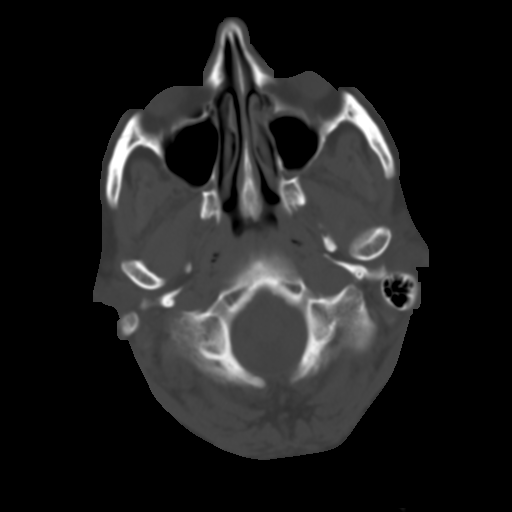
[im 6/33  brain]
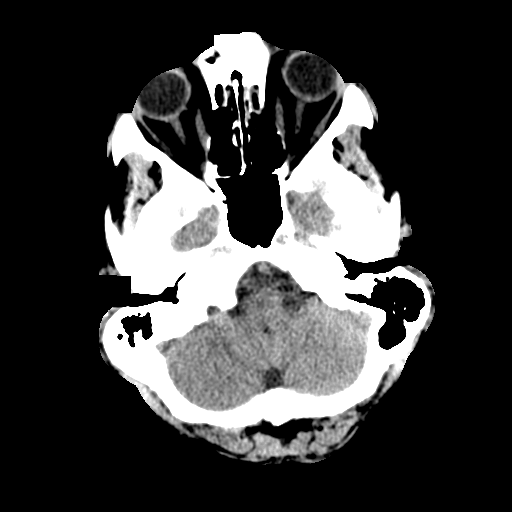
[im 9/33  brain]
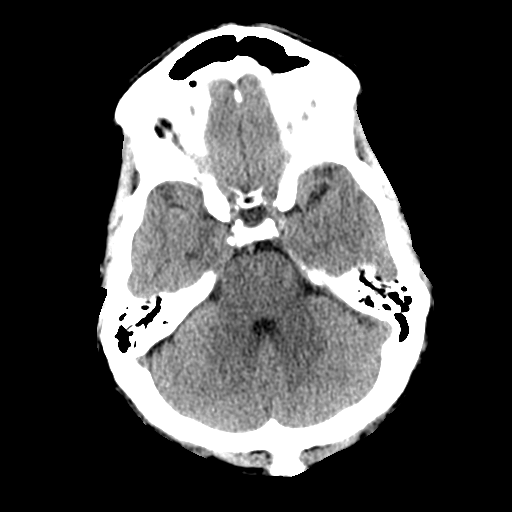
[im 13/33  brain]
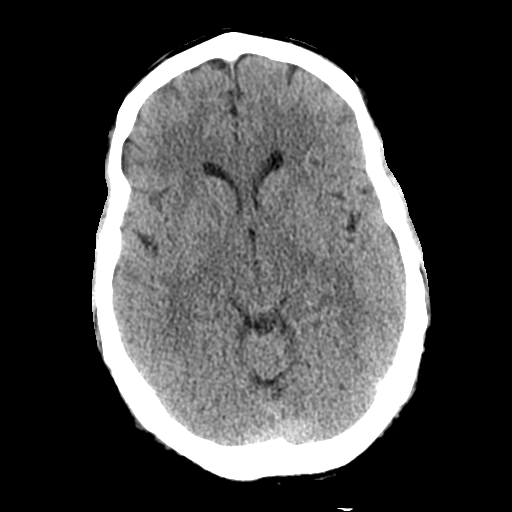
[im 17/33  brain]
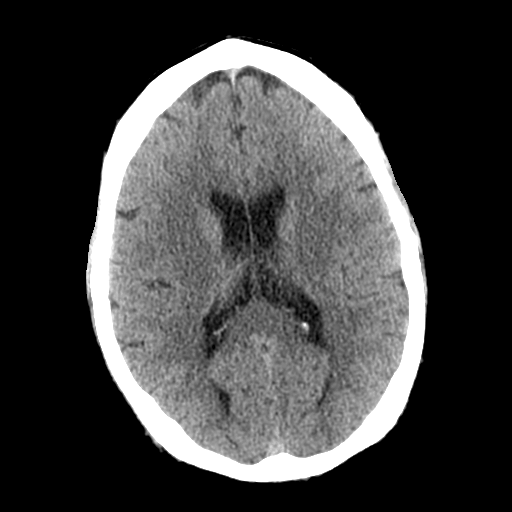
[im 17/33  bone]
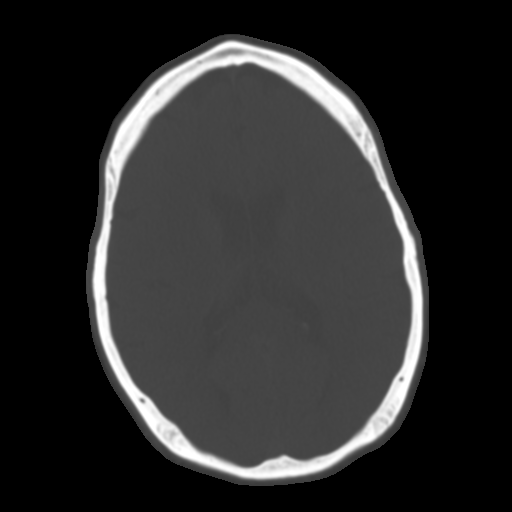
[im 20/33  brain]
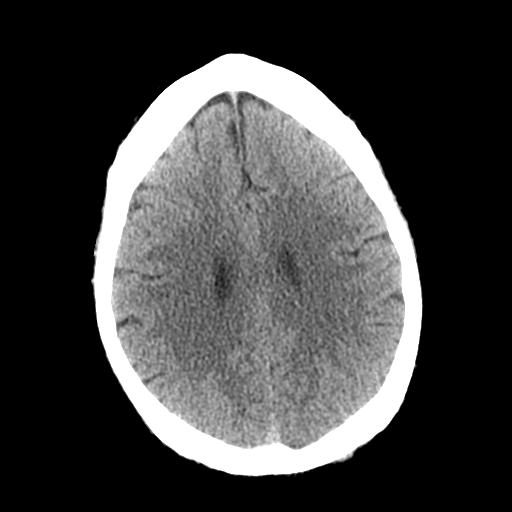
[im 24/33  brain]
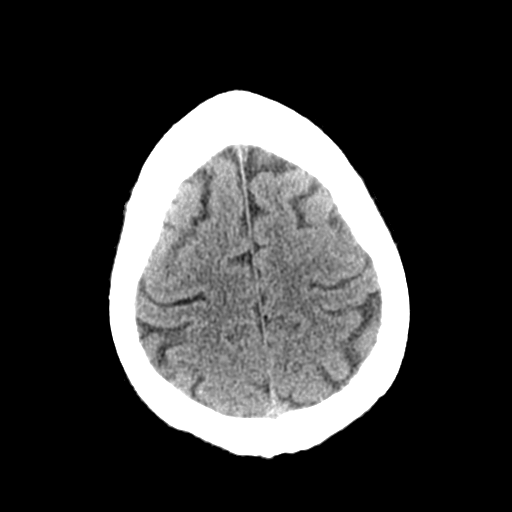
[im 27/33  brain]
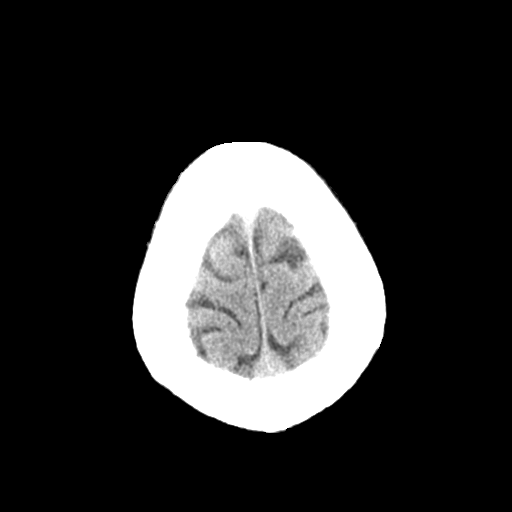
[im 30/33  brain]
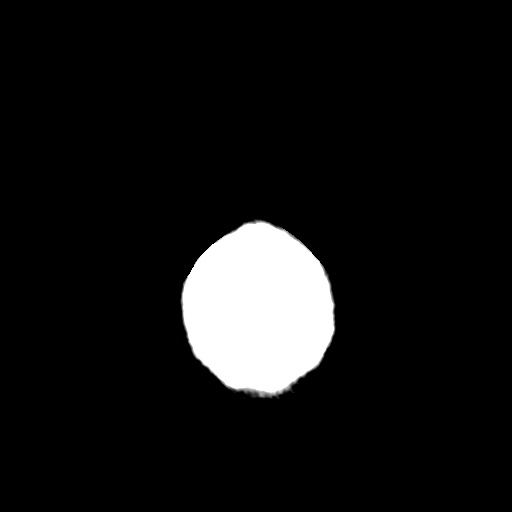
[im 30/33  bone]
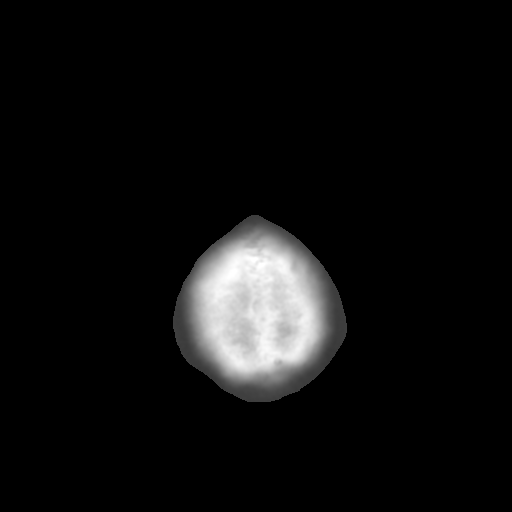

[Series 4: coronal soft · coronal · 0.34mm/px · 3 of 78 slices shown]
[im 26/78  brain]
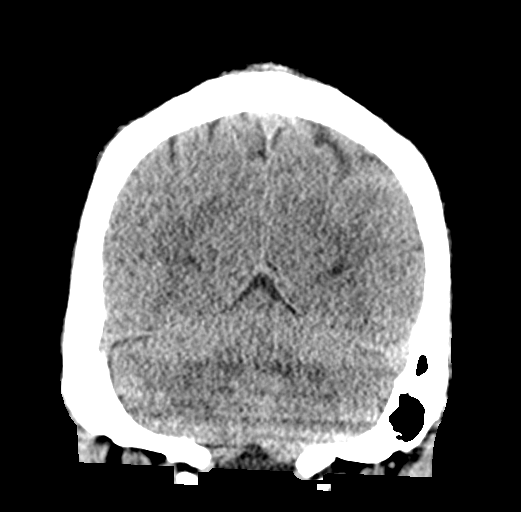
[im 35/78  brain]
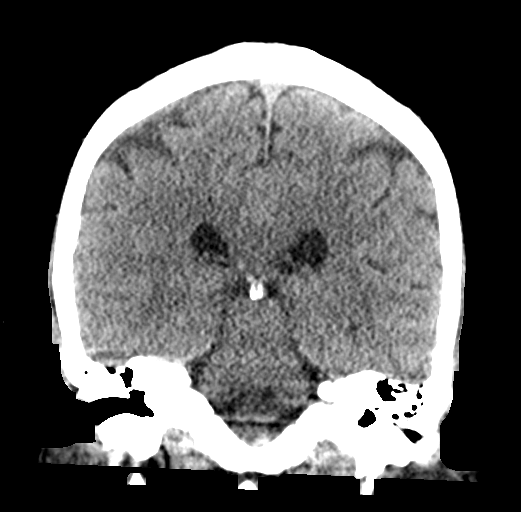
[im 43/78  brain]
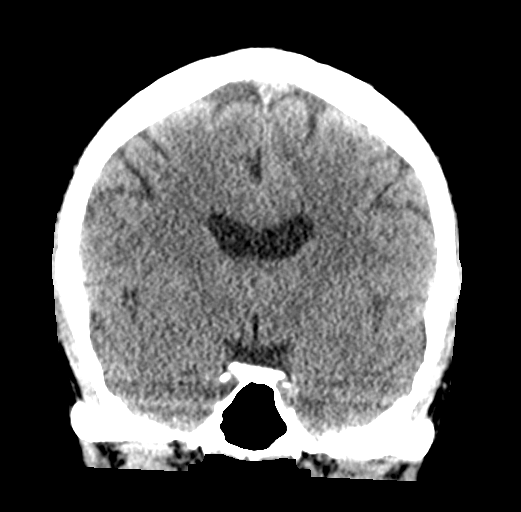

[Series 5: sagittal soft · sagittal · 0.37mm/px · 3 of 61 slices shown]
[im 21/61  brain]
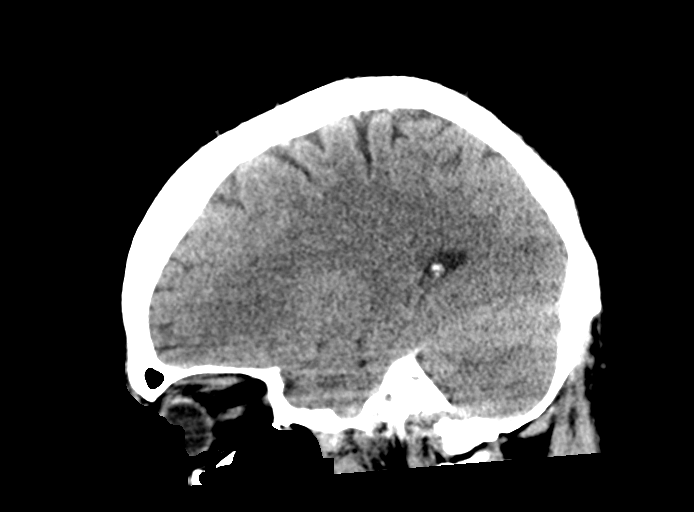
[im 31/61  brain]
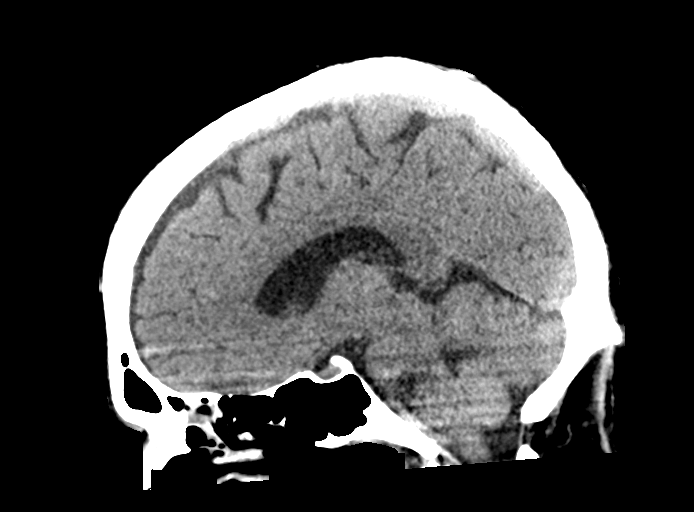
[im 41/61  brain]
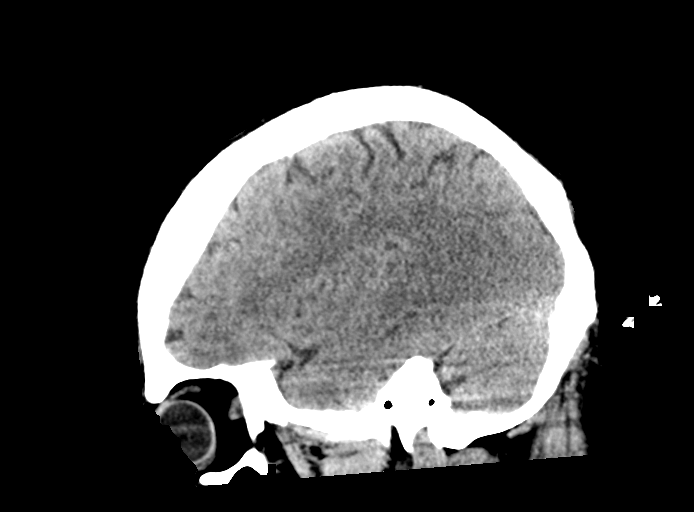

[15 of 47 positions shown; findings below may reference images not displayed]

FINDINGS: Brain: There is no evidence of acute intracranial hemorrhage,
extra-axial fluid collection, or acute infarct.

Parenchymal volume is normal. The ventricles are normal in size. The
parenchyma is normal in appearance. Gray-white differentiation is
preserved. There is no mass lesion. There is no midline shift.

Vascular: There is calcification of the bilateral cavernous ICAs.

Skull: Normal. Negative for fracture or focal lesion.

Sinuses/Orbits: The imaged paranasal sinuses are clear. The globes
and orbits are unremarkable.

Other: None.
IMPRESSION: No acute intracranial pathology.
